# Patient Record
Sex: Female | Born: 1935 | Race: Black or African American | Hispanic: No | Smoking: Never smoker
Health system: Southern US, Community
[De-identification: ages and names within clinical notes are randomized; demographics above are authoritative.]

## PROBLEM LIST (undated history)

## (undated) DIAGNOSIS — I10 Essential (primary) hypertension: Secondary | ICD-10-CM

## (undated) DIAGNOSIS — M1990 Unspecified osteoarthritis, unspecified site: Secondary | ICD-10-CM

---

## 2005-11-18 ENCOUNTER — Emergency Department: Payer: Self-pay | Admitting: Unknown Physician Specialty

## 2005-11-18 ENCOUNTER — Other Ambulatory Visit: Payer: Self-pay

## 2005-11-19 ENCOUNTER — Inpatient Hospital Stay: Payer: Self-pay | Admitting: General Surgery

## 2005-12-14 ENCOUNTER — Inpatient Hospital Stay: Payer: Self-pay | Admitting: Internal Medicine

## 2006-01-05 ENCOUNTER — Emergency Department: Payer: Self-pay | Admitting: General Practice

## 2006-03-12 ENCOUNTER — Emergency Department: Payer: Self-pay | Admitting: Emergency Medicine

## 2007-01-26 ENCOUNTER — Ambulatory Visit: Payer: Self-pay | Admitting: Internal Medicine

## 2007-08-12 ENCOUNTER — Emergency Department: Payer: Self-pay | Admitting: Emergency Medicine

## 2007-08-12 ENCOUNTER — Other Ambulatory Visit: Payer: Self-pay

## 2008-01-29 ENCOUNTER — Emergency Department: Payer: Self-pay | Admitting: Emergency Medicine

## 2008-01-29 ENCOUNTER — Other Ambulatory Visit: Payer: Self-pay

## 2008-09-21 ENCOUNTER — Ambulatory Visit: Payer: Self-pay | Admitting: Internal Medicine

## 2009-12-09 ENCOUNTER — Emergency Department: Payer: Self-pay | Admitting: Emergency Medicine

## 2011-09-07 ENCOUNTER — Emergency Department: Payer: Self-pay | Admitting: Emergency Medicine

## 2011-11-21 ENCOUNTER — Emergency Department: Payer: Self-pay | Admitting: *Deleted

## 2011-11-21 LAB — CBC
HGB: 11.6 g/dL — ABNORMAL LOW (ref 12.0–16.0)
MCH: 30.3 pg (ref 26.0–34.0)
MCHC: 33 g/dL (ref 32.0–36.0)
Platelet: 270 10*3/uL (ref 150–440)
RBC: 3.84 10*6/uL (ref 3.80–5.20)
RDW: 13.9 % (ref 11.5–14.5)

## 2011-11-21 LAB — COMPREHENSIVE METABOLIC PANEL
Alkaline Phosphatase: 103 U/L (ref 50–136)
Anion Gap: 7 (ref 7–16)
BUN: 14 mg/dL (ref 7–18)
Bilirubin,Total: 0.3 mg/dL (ref 0.2–1.0)
Calcium, Total: 9.4 mg/dL (ref 8.5–10.1)
Chloride: 105 mmol/L (ref 98–107)
Creatinine: 1.04 mg/dL (ref 0.60–1.30)
EGFR (African American): >60
Osmolality: 284 (ref 275–301)
SGPT (ALT): 23 U/L

## 2011-11-21 LAB — URINALYSIS, COMPLETE
Bilirubin,UR: NEGATIVE
Blood: NEGATIVE
Glucose,UR: NEGATIVE mg/dL (ref 0–75)
Ketone: NEGATIVE
Protein: NEGATIVE
Specific Gravity: 1.006 (ref 1.003–1.030)
Squamous Epithelial: <1
WBC UR: 18 /HPF (ref 0–5)

## 2012-02-25 ENCOUNTER — Ambulatory Visit: Payer: Self-pay | Admitting: Internal Medicine

## 2014-01-27 ENCOUNTER — Inpatient Hospital Stay: Payer: Self-pay | Admitting: Internal Medicine

## 2014-01-27 LAB — URINALYSIS, COMPLETE
BILIRUBIN, UR: NEGATIVE
Blood: NEGATIVE
Glucose,UR: NEGATIVE mg/dL (ref 0–75)
Hyaline Cast: 2
KETONE: NEGATIVE
Nitrite: NEGATIVE
Ph: 5 (ref 4.5–8.0)
Protein: NEGATIVE
RBC,UR: <1 /HPF (ref 0–5)
Renal Epithelial: <1
Specific Gravity: 1.01 (ref 1.003–1.030)
Squamous Epithelial: 1
Transitional Epi: 1
WBC UR: 44 /HPF (ref 0–5)

## 2014-01-27 LAB — BASIC METABOLIC PANEL
Anion Gap: 2 — ABNORMAL LOW (ref 7–16)
BUN: 22 mg/dL — AB (ref 7–18)
CALCIUM: 9.3 mg/dL (ref 8.5–10.1)
Chloride: 105 mmol/L (ref 98–107)
Co2: 26 mmol/L (ref 21–32)
Creatinine: 1.57 mg/dL — ABNORMAL HIGH (ref 0.60–1.30)
GFR CALC AF AMER: 34 — AB
GFR CALC NON AF AMER: 29 — AB
GLUCOSE: 126 mg/dL — AB (ref 65–99)
Osmolality: 271 (ref 275–301)
POTASSIUM: 4.2 mmol/L (ref 3.5–5.1)
SODIUM: 133 mmol/L — AB (ref 136–145)

## 2014-01-27 LAB — CBC
HCT: 34.6 % — ABNORMAL LOW (ref 35.0–47.0)
HGB: 11.6 g/dL — AB (ref 12.0–16.0)
MCH: 30.5 pg (ref 26.0–34.0)
MCHC: 33.5 g/dL (ref 32.0–36.0)
MCV: 91 fL (ref 80–100)
PLATELETS: 245 10*3/uL (ref 150–440)
RBC: 3.79 10*6/uL — ABNORMAL LOW (ref 3.80–5.20)
RDW: 13.4 % (ref 11.5–14.5)
WBC: 6.4 10*3/uL (ref 3.6–11.0)

## 2014-01-27 LAB — TROPONIN I: Troponin-I: <0.02 ng/mL

## 2014-01-28 LAB — CBC WITH DIFFERENTIAL/PLATELET
BASOS PCT: 0.9 %
Basophil #: 0.1 10*3/uL (ref 0.0–0.1)
Eosinophil #: 0.1 10*3/uL (ref 0.0–0.7)
Eosinophil %: 2 %
HCT: 35.3 % (ref 35.0–47.0)
HGB: 11.8 g/dL — AB (ref 12.0–16.0)
Lymphocyte #: 1.8 10*3/uL (ref 1.0–3.6)
Lymphocyte %: 30.7 %
MCH: 30.5 pg (ref 26.0–34.0)
MCHC: 33.4 g/dL (ref 32.0–36.0)
MCV: 91 fL (ref 80–100)
MONO ABS: 0.6 x10 3/mm (ref 0.2–0.9)
MONOS PCT: 10.1 %
Neutrophil #: 3.2 10*3/uL (ref 1.4–6.5)
Neutrophil %: 56.3 %
Platelet: 253 10*3/uL (ref 150–440)
RBC: 3.87 10*6/uL (ref 3.80–5.20)
RDW: 13.3 % (ref 11.5–14.5)
WBC: 5.7 10*3/uL (ref 3.6–11.0)

## 2014-01-28 LAB — COMPREHENSIVE METABOLIC PANEL
ALK PHOS: 93 U/L
Albumin: 3.3 g/dL — ABNORMAL LOW (ref 3.4–5.0)
Anion Gap: 7 (ref 7–16)
BUN: 19 mg/dL — ABNORMAL HIGH (ref 7–18)
Bilirubin,Total: 0.3 mg/dL (ref 0.2–1.0)
CO2: 27 mmol/L (ref 21–32)
Calcium, Total: 9.6 mg/dL (ref 8.5–10.1)
Chloride: 105 mmol/L (ref 98–107)
Creatinine: 1.23 mg/dL (ref 0.60–1.30)
EGFR (African American): 46 — ABNORMAL LOW
EGFR (Non-African Amer.): 39 — ABNORMAL LOW
GLUCOSE: 114 mg/dL — AB (ref 65–99)
Osmolality: 281 (ref 275–301)
Potassium: 4.8 mmol/L (ref 3.5–5.1)
SGOT(AST): 14 U/L — ABNORMAL LOW (ref 15–37)
SGPT (ALT): 12 U/L (ref 12–78)
SODIUM: 139 mmol/L (ref 136–145)
Total Protein: 6.7 g/dL (ref 6.4–8.2)

## 2014-01-28 LAB — HEMATOCRIT: HCT: 35.2 % (ref 35.0–47.0)

## 2014-01-28 LAB — MAGNESIUM: Magnesium: 2.1 mg/dL

## 2014-01-28 LAB — HEMOGLOBIN: HGB: 11.6 g/dL — AB (ref 12.0–16.0)

## 2014-01-29 LAB — CBC WITH DIFFERENTIAL/PLATELET
BASOS PCT: 0.6 %
Basophil #: 0 10*3/uL (ref 0.0–0.1)
Eosinophil #: 0 10*3/uL (ref 0.0–0.7)
Eosinophil %: 0.7 %
HCT: 33.5 % — ABNORMAL LOW (ref 35.0–47.0)
HGB: 10.9 g/dL — ABNORMAL LOW (ref 12.0–16.0)
LYMPHS PCT: 23.9 %
Lymphocyte #: 1.5 10*3/uL (ref 1.0–3.6)
MCH: 29.9 pg (ref 26.0–34.0)
MCHC: 32.5 g/dL (ref 32.0–36.0)
MCV: 92 fL (ref 80–100)
Monocyte #: 0.4 x10 3/mm (ref 0.2–0.9)
Monocyte %: 6 %
NEUTROS ABS: 4.2 10*3/uL (ref 1.4–6.5)
Neutrophil %: 68.8 %
PLATELETS: 230 10*3/uL (ref 150–440)
RBC: 3.64 10*6/uL — AB (ref 3.80–5.20)
RDW: 13.2 % (ref 11.5–14.5)
WBC: 6.1 10*3/uL (ref 3.6–11.0)

## 2014-01-29 LAB — BASIC METABOLIC PANEL
Anion Gap: 4 — ABNORMAL LOW (ref 7–16)
BUN: 15 mg/dL (ref 7–18)
CHLORIDE: 105 mmol/L (ref 98–107)
CREATININE: 1.32 mg/dL — AB (ref 0.60–1.30)
Calcium, Total: 9.2 mg/dL (ref 8.5–10.1)
Co2: 27 mmol/L (ref 21–32)
EGFR (African American): 42 — ABNORMAL LOW
EGFR (Non-African Amer.): 36 — ABNORMAL LOW
Glucose: 103 mg/dL — ABNORMAL HIGH (ref 65–99)
OSMOLALITY: 273 (ref 275–301)
POTASSIUM: 4.8 mmol/L (ref 3.5–5.1)
SODIUM: 136 mmol/L (ref 136–145)

## 2014-01-29 LAB — URINE CULTURE

## 2014-01-31 LAB — CBC WITH DIFFERENTIAL/PLATELET
BASOS PCT: 0.6 %
Basophil #: 0.1 10*3/uL (ref 0.0–0.1)
Eosinophil #: 0.1 10*3/uL (ref 0.0–0.7)
Eosinophil %: 0.8 %
HCT: 36.2 % (ref 35.0–47.0)
HGB: 12 g/dL (ref 12.0–16.0)
Lymphocyte #: 1.4 10*3/uL (ref 1.0–3.6)
Lymphocyte %: 17.7 %
MCH: 30 pg (ref 26.0–34.0)
MCHC: 33.1 g/dL (ref 32.0–36.0)
MCV: 91 fL (ref 80–100)
MONO ABS: 0.7 x10 3/mm (ref 0.2–0.9)
Monocyte %: 9.3 %
Neutrophil #: 5.7 10*3/uL (ref 1.4–6.5)
Neutrophil %: 71.6 %
PLATELETS: 237 10*3/uL (ref 150–440)
RBC: 3.99 10*6/uL (ref 3.80–5.20)
RDW: 13.4 % (ref 11.5–14.5)
WBC: 8 10*3/uL (ref 3.6–11.0)

## 2014-01-31 LAB — BASIC METABOLIC PANEL
ANION GAP: 5 — AB (ref 7–16)
BUN: 11 mg/dL (ref 7–18)
CHLORIDE: 101 mmol/L (ref 98–107)
CREATININE: 1.27 mg/dL (ref 0.60–1.30)
Calcium, Total: 9.4 mg/dL (ref 8.5–10.1)
Co2: 29 mmol/L (ref 21–32)
EGFR (African American): 44 — ABNORMAL LOW
EGFR (Non-African Amer.): 38 — ABNORMAL LOW
GLUCOSE: 115 mg/dL — AB (ref 65–99)
Osmolality: 270 (ref 275–301)
Potassium: 4.3 mmol/L (ref 3.5–5.1)
Sodium: 135 mmol/L — ABNORMAL LOW (ref 136–145)

## 2014-02-01 LAB — CBC WITH DIFFERENTIAL/PLATELET
BASOS ABS: 0 10*3/uL (ref 0.0–0.1)
BASOS PCT: 0.5 %
EOS PCT: 0.4 %
Eosinophil #: 0 10*3/uL (ref 0.0–0.7)
HCT: 33.8 % — AB (ref 35.0–47.0)
HGB: 11.6 g/dL — ABNORMAL LOW (ref 12.0–16.0)
Lymphocyte #: 1 10*3/uL (ref 1.0–3.6)
Lymphocyte %: 13 %
MCH: 30.7 pg (ref 26.0–34.0)
MCHC: 34.2 g/dL (ref 32.0–36.0)
MCV: 90 fL (ref 80–100)
MONOS PCT: 10.9 %
Monocyte #: 0.8 x10 3/mm (ref 0.2–0.9)
Neutrophil #: 5.6 10*3/uL (ref 1.4–6.5)
Neutrophil %: 75.2 %
PLATELETS: 222 10*3/uL (ref 150–440)
RBC: 3.76 10*6/uL — ABNORMAL LOW (ref 3.80–5.20)
RDW: 13.3 % (ref 11.5–14.5)
WBC: 7.5 10*3/uL (ref 3.6–11.0)

## 2014-02-01 LAB — BASIC METABOLIC PANEL
Anion Gap: 8 (ref 7–16)
BUN: 15 mg/dL (ref 7–18)
CREATININE: 1.06 mg/dL (ref 0.60–1.30)
Calcium, Total: 9.7 mg/dL (ref 8.5–10.1)
Chloride: 96 mmol/L — ABNORMAL LOW (ref 98–107)
Co2: 29 mmol/L (ref 21–32)
EGFR (Non-African Amer.): 47 — ABNORMAL LOW
GFR CALC AF AMER: 55 — AB
GLUCOSE: 150 mg/dL — AB (ref 65–99)
OSMOLALITY: 270 (ref 275–301)
Potassium: 4 mmol/L (ref 3.5–5.1)
SODIUM: 133 mmol/L — AB (ref 136–145)

## 2014-02-25 ENCOUNTER — Emergency Department: Payer: Self-pay | Admitting: Emergency Medicine

## 2014-02-25 LAB — BASIC METABOLIC PANEL
Anion Gap: 4 — ABNORMAL LOW (ref 7–16)
BUN: 24 mg/dL — ABNORMAL HIGH (ref 7–18)
CALCIUM: 9.2 mg/dL (ref 8.5–10.1)
CO2: 32 mmol/L (ref 21–32)
CREATININE: 1.43 mg/dL — AB (ref 0.60–1.30)
Chloride: 100 mmol/L (ref 98–107)
EGFR (Non-African Amer.): 33 — ABNORMAL LOW
GFR CALC AF AMER: 38 — AB
GLUCOSE: 118 mg/dL — AB (ref 65–99)
Osmolality: 277 (ref 275–301)
Potassium: 5.1 mmol/L (ref 3.5–5.1)
Sodium: 136 mmol/L (ref 136–145)

## 2014-02-25 LAB — CBC
HCT: 34.6 % — AB (ref 35.0–47.0)
HGB: 11.3 g/dL — AB (ref 12.0–16.0)
MCH: 29.2 pg (ref 26.0–34.0)
MCHC: 32.5 g/dL (ref 32.0–36.0)
MCV: 90 fL (ref 80–100)
Platelet: 249 10*3/uL (ref 150–440)
RBC: 3.86 10*6/uL (ref 3.80–5.20)
RDW: 12.9 % (ref 11.5–14.5)
WBC: 6.4 10*3/uL (ref 3.6–11.0)

## 2014-12-03 ENCOUNTER — Emergency Department
Admit: 2014-12-03 | Disposition: A | Discharge status: Home / Self Care | Payer: Self-pay | Admitting: Emergency Medicine

## 2014-12-03 LAB — CBC
HCT: 35.9 % (ref 35.0–47.0)
HGB: 11.8 g/dL — AB (ref 12.0–16.0)
MCH: 30 pg (ref 26.0–34.0)
MCHC: 32.7 g/dL (ref 32.0–36.0)
MCV: 92 fL (ref 80–100)
Platelet: 262 10*3/uL (ref 150–440)
RBC: 3.92 10*6/uL (ref 3.80–5.20)
RDW: 14.2 % (ref 11.5–14.5)
WBC: 5.4 10*3/uL (ref 3.6–11.0)

## 2014-12-03 LAB — BASIC METABOLIC PANEL
ANION GAP: 6 — AB (ref 7–16)
BUN: 16 mg/dL
CALCIUM: 9.4 mg/dL
Chloride: 108 mmol/L
Co2: 28 mmol/L
Creatinine: 0.83 mg/dL
Glucose: 148 mg/dL — ABNORMAL HIGH
POTASSIUM: 3.5 mmol/L
SODIUM: 142 mmol/L

## 2014-12-04 ENCOUNTER — Emergency Department: Admit: 2014-12-04 | Disposition: A | Discharge status: Home / Self Care | Payer: Self-pay | Admitting: Student

## 2014-12-09 NOTE — Consult Note (Signed)
PATIENT NAME:  Rhonda Nelson, Lian E MR#:  [id number] DATE OF BIRTH:  1924-10-13  DATE OF CONSULTATION:  01/28/2014  CONSULTING PHYSICIAN:  Ezzard StandingPaul Y. Chancey Ringel, MD  REASON FOR CONSULTATION: Melena, anemia.   HISTORY OF PRESENT ILLNESS: The patient is an 81 year old black female with past medical history of hypertension, hyperlipidemia, and coronary artery disease who was brought in to the Emergency Room after she had a large black, tarry stool yesterday morning. She felt weak and it was difficult to get out of the commode. As a result, she was brought in. Even though she felt dizzy and weak she never lost any consciousness. This is the first time she has noticed any black stools. She denies any abdominal pain. She felt nauseous but did not vomit. There are no fevers or chills. Her appetite has been somewhat down but she is not aware whether she lost any weight or not.   The patient has been taking meloxicam daily for the past several months presumably for arthritis She does not take any stomach medicines.   She has not had any further bleeding since admission.   PAST MEDICAL HISTORY: Hyperlipidemia, coronary artery disease. She did have a bout of intestinal obstruction in the past. Other history includes leaky valves.   PAST SURGICAL HISTORY: Include cholecystectomy, hysterectomy and laparotomy for intestinal obstruction.   ALLERGIES: ALEVE AND MOTRIN.   SOCIAL HISTORY: She lives alone. Does not smoke or drink.   FAMILY HISTORY: Notable for hypertension.   HOME MEDICATIONS: Include:  Meloxicam 7.5 mg daily, lisinopril 20 mg daily and amlodipine 5 mg daily.  REVIEW OF SYSTEMS:  Please refer to the initial H and P dictated by the admitting doctor. There has not been any changes.   PHYSICAL EXAMINATION: GENERAL: The patient is in no acute distress.  VITAL SIGNS: She is afebrile. Vital signs are stable.  HEENT: Normocephalic, atraumatic head. Pupils are equally reactive. Throat was clear.  NECK: Supple.   CARDIAC: Regular rhythm and rate with murmur.  LUNGS: Clear bilaterally.  ABDOMEN: Normoactive bowel sounds, soft. It was nontender throughout. There is no hepatomegaly. She had active bowel sounds.  NEUROLOGIC: Examination is nonfocal although she was rather poor historian.  EXTREMITIES: No clubbing, clubbing, cyanosis, or edema.  SKIN: Negative.   LABORATORY DATA: Initial hemoglobin on admission was 11.6, even with IV hydration did not drop any further. It was 11.8 this morning. Electrolytes, creatinine was 1.57. With hydration went down to 1.23. Sodium 139, potassium 4.8, chloride 105, CO2 27, BUN was 19, glucose 114. Liver enzymes were normal. TPK level was less than 0.02. She does have evidence of urinary tract infection.   ASSESSMENT AND PLAN: This is a patient who had an episode of melena with some presyncopal episode. She is anemic. Fortunately for her, even with IV hydration her hemoglobin has not dropped significantly, which suggested there is no active bleeding right now. The patient likely has NSAID-induced bleeding ulcer. There is no urgency in doing endoscopy. We will plan doing endoscopy on Monday unless the patient develops active bleeding. We will place a patient on liquid diet   We will continue to monitor the hemoglobin. The patient is already put on proton pump inhibitor, which should continue on a daily basis. I will continue to follow the patient. Thank you for the referral.   ____________________________ Ezzard StandingPaul Y. Bluford Kaufmannh, MD pyo:sg D: 01/29/2014 08:46:49 ET T: 01/29/2014 09:28:39 ET JOB#: [id number]  cc: Ezzard StandingPaul Y. Bluford Kaufmannh, MD, <Dictator> Ezzard StandingPAUL Y Thaddaeus Granja MD ELECTRONICALLY SIGNED 01/30/2014  10:25 

## 2014-12-09 NOTE — H&P (Signed)
PATIENT NAME:  Rhonda Nelson, [name] MR#:  [id number] DATE OF BIRTH:  1925-04-11  DATE OF ADMISSION:  01/28/2014  PRIMARY CARE PHYSICIAN:  Dr. Marcello FennelHande.  REFERRING PHYSICIAN:  Governor Rooksebecca Lord, M.D.   CHIEF COMPLAINT:  Black tarry stool.   HISTORY OF PRESENT ILLNESS:  The patient is an 81 year old female with past medical history of hypertension, hyperlipidemia and coronary artery disease was brought into the ER via EMS for black tarry stool.  The patient is reporting that this morning when she went to bathroom she had a large black tarry stool and became extremely weak.  The patient lives alone and she was unable to get off the commode.  She has called EMS who has recommended the patient to come to the ER.  The patient has refused and subsequently she called her daughter.  Daughter went to check on her and as the patient was extremely weak she has called EMS again and eventually the patient is brought into the ED.  She is feeling weak and dizzy, but denies any loss of consciousness.  She was nauseous, but denies any vomiting.  Denies any abdominal pain.  No similar complaints in the past.  The patient is also diagnosed with a urinary tract infection.  She was given IV Rocephin and hospitalist team is called to admit the patient.  During my examination, the patient is resting comfortably.  Denies any abdominal pain other than generalized weakness.  Daughter and son are at bedside.   PAST MEDICAL HISTORY:  Hypertension, hyperlipidemia, coronary artery disease, history of intestinal obstruction and kidney stone.  Also history of leaky valve.  Osteoarthritis of the knees.   PAST SURGICAL HISTORY:  Cholecystectomy, total hysterectomy, exploratory laparotomy for intestinal obstruction.   ALLERGIES:  SHE IS ALLERGIC TO ALEVE AND MOTRIN.   PSYCHOSOCIAL HISTORY:  Lives at home, lives alone.  She gets Meals on Wheels.  Daughter frequently comes and checks on her mom.   FAMILY HISTORY:  Hypertension runs in her family.    HOME MEDICATIONS:  Meloxicam 7.5 mg 1 tablet by mouth once daily with food, lisinopril 20 mg once daily, amlodipine 5 mg once daily.   REVIEW OF SYSTEMS: CONSTITUTIONAL:  Denies any fever, fatigue.  Complaining of weakness.  EYES:  Denies blurry vision, double vision, glaucoma.  EARS, NOSE, THROAT:  Denies epistaxis, discharge.  RESPIRATION:  Denies cough, COPD.  CARDIOVASCULAR:  No chest pain, palpitations.  Complaining of dizziness.  No syncope.  GASTROINTESTINAL:  Has nausea.  Denies vomiting.  Passed black tarry stool.  Denies any abdominal pain.  Denies any hematemesis or positive melena.  GENITOURINARY:  No dysuria, hematuria, or urinary frequency.  Has history of kidney stones in the past.  GYNECOLOGIC AND BREASTS:  Denies breast mass.  Status post hysterectomy.  Denies any vaginal discharge.  ENDOCRINE:  Denies polyuria, nocturia, thyroid problems.  HEMATOLOGIC AND LYMPHATIC:  Positive anemia.  Positive melena.  INTEGUMENTARY:  No acne, rash, lesions.  MUSCULOSKELETAL:  No joint pain in the neck and back.  Has knee pain.  She is supposed to get total knee replacement for severe osteoarthritis.  NEUROLOGIC:  Denies vertigo, ataxia, dysarthria.  PSYCHIATRIC:  No ADD, OCD.   PHYSICAL EXAMINATION: VITAL SIGNS:  Temperature 98, pulse 76, respirations 18, blood pressure 121/74, pulse ox is 97%.  GENERAL APPEARANCE:  Not under acute distress.  Moderately built and nourished.  HEENT:  Normocephalic, atraumatic.  Pupils are equally reacting to light and accommodation.  No scleral icterus.  No conjunctival  injection.  No sinus tenderness.  No postnasal drip.  Dry mucous membranes. NECK:  Supple.  No JVD.  No thyromegaly.  Range of motion is intact.  LUNGS:  Clear to auscultation bilaterally.  No accessory muscle usage.  No anterior chest wall tenderness on palpation.  CARDIAC:  S1, S2 normal.  Regular rate and rhythm.  Positive murmur noted.  GASTROINTESTINAL:  Soft.  Bowel sounds are  positive in all four quadrants.  Nontender, nondistended.  No hepatosplenomegaly.  No masses felt.  NEUROLOGIC:  Awake, alert, oriented x 3.  Cranial nerves II through XII are grossly intact.  Motor and sensory are intact.  Reflexes are 2+.  EXTREMITIES:  No edema.  No cyanosis.  No clubbing.  SKIN:  Warm to touch.  Decreased turgor.  No rashes.  No lesions.  MUSCULOSKELETAL:  No joint effusion.  No erythema.   LABORATORY AND IMAGING STUDIES:  Troponin less than 0.02.  WBC 6.4, hemoglobin is 11.6, hematocrit 34.6, platelets 245.  Urinalysis, yellow in color, clear in appearance.  Nitrites, are negative, leukocyte esterase 3+.  Chem-8:  Glucose 126, BUN 22, creatinine 1.5.  Serum sodium 133, potassium 4.2, chloride normal.  CO2 normal.  Anion gap is 2.  GFR 34, serum osmolality and calcium are normal.   ASSESSMENT AND PLAN:  An 81 year old African American female presenting to the Emergency Room after she had a large melena stool associated with weakness will be admitted with the following assessment and plan.  1.  Upper gastrointestinal bleed, probably nonsteroidal anti-inflammatory drug related.  We will keep her nothing by mouth, provide intravenous fluids, proton pump inhibitor GTT.  We will check hemoglobin and hematocrit q. 6 hours.  If necessary, we will provide blood transfusion.  Consent was obtained.  Gastroenterology consult is placed.  Stop nonsteroidal anti-inflammatory drugs.   2.  Acute cystitis.  Urine culture is ordered.  The patient will be receiving intravenous Rocephin.  3.  Hypertension.  Currently, the patient is nothing by mouth.  If necessary, we will provide her intravenous antihypertensive and hold off on home medications.  4.  History of coronary artery disease.  Denies any chest pain or shortness of breath.  No other complaints.  Troponin is negative.   5.  We will provide her gastrointestinal prophylaxis with Protonix and deep vein thrombosis prophylaxis with sequential  compression devices.  6.  CODE STATUS:  SHE IS FULL CODE.  Daughter is the medical power of attorney.    Diagnosis and plan of care was discussed in detail with the patient.  Daughter and son at bedside.  They all verbalized understanding of the plan.   Total time spent on the admission is 50 minutes.  Please transfer the patient to Dr. Marcello Fennel in a.m.    ____________________________ Ramonita Lab, MD ag:ea D: 01/28/2014 01:09:07 ET T: 01/28/2014 01:51:21 ET JOB#: [id number]  cc: Ramonita Lab, MD, <Dictator> Ramonita Lab MD ELECTRONICALLY SIGNED 02/05/2014 0:56

## 2014-12-09 NOTE — Consult Note (Signed)
Pt seen and examined. Full consult to follow. Pt with hx of HTN, hyperlipedemia, and CAD, who was admitted overnight for large bout of melena and near syncopal episode. Found to be anemic, although hgb has not fallen since admission even with IV hydration. Poor historian. On meloxicam daily for few months. Nausea yesterday but denies GI sxs prior to yesterday. Abdomen nontender. Likely has NSAIDS-induced bleeding ulcer. No urgency in EGD. Will plan on Monday unless patient has active bleeding. Liquid diet today. Moniter hgb. Continue PPI daily. Will follow. Thanks.  Electronic Signatures: Lutricia Feilh, Bernarda Erck (MD)  (Signed on 13-Jun-15 10:14)  Authored  Last Updated: 13-Jun-15 10:14 by Lutricia Feilh, Leda Bellefeuille (MD)

## 2014-12-09 NOTE — Consult Note (Signed)
Chief Complaint:  Subjective/Chief Complaint No further bleeding. Sl drop in hgb. Tolerating clears.   VITAL SIGNS/ANCILLARY NOTES: **Vital Signs.:   14-Jun-15 04:12  Vital Signs Type Routine  Temperature Temperature (F) 98.5  Celsius 36.9  Temperature Source oral  Pulse Pulse 68  Respirations Respirations 20  Systolic BP Systolic BP 797  Diastolic BP (mmHg) Diastolic BP (mmHg) 60  Mean BP 73  Pulse Ox % Pulse Ox % 91  Pulse Ox Activity Level  At rest  Oxygen Delivery Room Air/ 21 %   Brief Assessment:  GEN no acute distress   Cardiac Regular   Respiratory clear BS   Gastrointestinal Normal   Lab Results: Routine Chem:  14-Jun-15 04:48   Glucose, Serum  103  BUN 15  Creatinine (comp)  1.32  Sodium, Serum 136  Potassium, Serum 4.8  Chloride, Serum 105  CO2, Serum 27  Calcium (Total), Serum 9.2  Anion Gap  4  Osmolality (calc) 273  eGFR (African American)  42  eGFR (Non-African American)  36 (eGFR values <85m/min/1.73 m2 may be an indication of chronic kidney disease (CKD). Calculated eGFR is useful in patients with stable renal function. The eGFR calculation will not be reliable in acutely ill patients when serum creatinine is changing rapidly. It is not useful in  patients on dialysis. The eGFR calculation may not be applicable to patients at the low and high extremes of body sizes, pregnant women, and vegetarians.)  Routine Hem:  14-Jun-15 04:48   WBC (CBC) 6.1  RBC (CBC)  3.64  Hemoglobin (CBC)  10.9  Hematocrit (CBC)  33.5  Platelet Count (CBC) 230  MCV 92  MCH 29.9  MCHC 32.5  RDW 13.2  Neutrophil % 68.8  Lymphocyte % 23.9  Monocyte % 6.0  Eosinophil % 0.7  Basophil % 0.6  Neutrophil # 4.2  Lymphocyte # 1.5  Monocyte # 0.4  Eosinophil # 0.0  Basophil # 0.0 (Result(s) reported on 29 Jan 2014 at 06:13AM.)   Assessment/Plan:  Assessment/Plan:  Assessment UGI bleed. No active bleeding.   Plan Full liquid today but NPO after MN. Plan EGD  tomorrow AM. thanks   Electronic Signatures: OVerdie Shire(MD)  (Signed 14-Jun-15 09:23)  Authored: Chief Complaint, VITAL SIGNS/ANCILLARY NOTES, Brief Assessment, Lab Results, Assessment/Plan   Last Updated: 14-Jun-15 09:23 by OVerdie Shire(MD)

## 2014-12-09 NOTE — Discharge Summary (Signed)
PATIENT NAME:  Rhonda Nelson, Rhonda Nelson MR#:  [id number] DATE OF BIRTH:  [birth year]  DATE OF ADMISSION:  01/27/2014 DATE OF DISCHARGE:  01/31/2014   DIAGNOSES AT TIME OF DISCHARGE: 1.  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  2.  Osteoarthritis.  3.  Hypertension  4.  History of coronary artery disease.  5.  Urinary tract infection.   CHIEF COMPLAINT: Black tarry stools.   HISTORY OF PRESENT ILLNESS: Rhonda Nelson is an 81 year old female with past medical history significant for hypertension, hyperlipidemia, CAD, was brought to the ER via EMS because of black tarry stools. The patient states that she went to the bathroom and had a bowel movement and was noted to have black stools, and also patient reported a sense of weakness. She was unable to get off the commode. The patient also reported a sense of feeling dizzy and weak. Denies any loss of consciousness. Denies any vomiting. No abdominal pain. Has had no similar complaints in the past. The patient is also noted to have a urinary tract infection and was started on IV Rocephin.   PAST MEDICAL HISTORY: Significant for hypertension, hyperlipidemia, CAD, history of intestinal obstruction and kidney stone, history of osteoarthritis of the knees. She has had previous cholecystectomy, hysterectomy, exploratory laparotomy.  HOSPITAL COURSE:  Please see H and P for other details. The patient was [facility]. Initial labs showed a hemoglobin of 11.6. She was seen by gastroenterologist, Dr. Bluford Kaufmannh, and underwent an EGD which showed a gastric AVM that was cauterized. There was no evidence of any bleeding ulcers. A regular diet was ordered. The patient continued to experience pain in both knees and was started on tramadol for pain control.   DISCHARGE MEDICATIONS: She was discharged to rehab in stable condition on the following medications: Ferrous sulfate 325 mg once a day, lisinopril 20 mg once a day, amlodipine  5 mg once a day, pantoprazole 40 mg b.i.d., docusate sodium 100 mg p.o. b.i.d., and tramadol 50 mg q.6 hours.  The patient was advised to follow up with me, Dr. Marcello FennelHande, in 1 to 2 weeks' time. She was stable at the time of discharge.   TOTAL TIME SPENT ON DISCHARGING THIS PATIENT: 35 minutes.   ____________________________ Barbette ReichmannVishwanath Hande, MD vh:jcm D: 01/31/2014 12:56:32 ET T: 01/31/2014 15:17:13 ET JOB#: [id number]  cc: Barbette ReichmannVishwanath Hande, MD, <Dictator> Barbette ReichmannVISHWANATH HANDE MD ELECTRONICALLY SIGNED 02/15/2014 18:09

## 2014-12-09 NOTE — Consult Note (Signed)
EGD showed a gastric AVM that was cauterized. Did not see any bleeding ulcers. Reg diet ordered. Continue daily PPI once a day if patient requires NSAIDS at home. Will sign off. Thanks.  Electronic Signatures: Lutricia Feilh, Floetta Brickey (MD)  (Signed on 15-Jun-15 10:19)  Authored  Last Updated: 15-Jun-15 10:19 by Lutricia Feilh, Thurza Kwiecinski (MD)

## 2015-01-11 ENCOUNTER — Ambulatory Visit
Admission: RE | Admit: 2015-01-11 | Discharge: 2015-01-11 | Disposition: A | Discharge status: Home / Self Care | Payer: Medicare Other | Source: Ambulatory Visit | Attending: Internal Medicine | Admitting: Internal Medicine

## 2015-01-11 ENCOUNTER — Other Ambulatory Visit: Payer: Self-pay | Admitting: Internal Medicine

## 2015-01-11 DIAGNOSIS — M7989 Other specified soft tissue disorders: Secondary | ICD-10-CM

## 2015-01-25 ENCOUNTER — Other Ambulatory Visit: Payer: Self-pay

## 2015-01-25 ENCOUNTER — Encounter: Payer: Self-pay | Admitting: Emergency Medicine

## 2015-01-25 ENCOUNTER — Emergency Department
Admission: EM | Admit: 2015-01-25 | Discharge: 2015-01-26 | Disposition: A | Discharge status: Home / Self Care | Payer: Medicare Other | Attending: Emergency Medicine | Admitting: Emergency Medicine

## 2015-01-25 DIAGNOSIS — I10 Essential (primary) hypertension: Secondary | ICD-10-CM | POA: Diagnosis not present

## 2015-01-25 DIAGNOSIS — R1030 Lower abdominal pain, unspecified: Secondary | ICD-10-CM

## 2015-01-25 DIAGNOSIS — K5900 Constipation, unspecified: Secondary | ICD-10-CM | POA: Diagnosis not present

## 2015-01-25 DIAGNOSIS — M79605 Pain in left leg: Secondary | ICD-10-CM | POA: Insufficient documentation

## 2015-01-25 DIAGNOSIS — E119 Type 2 diabetes mellitus without complications: Secondary | ICD-10-CM | POA: Insufficient documentation

## 2015-01-25 DIAGNOSIS — M79604 Pain in right leg: Secondary | ICD-10-CM | POA: Insufficient documentation

## 2015-01-25 HISTORY — DX: Unspecified osteoarthritis, unspecified site: M19.90

## 2015-01-25 HISTORY — DX: Essential (primary) hypertension: I10

## 2015-01-25 NOTE — ED Notes (Signed)
Pt to rm 10 via EMS.  EMS reports pt c/o lower abd pain since 11am after eating.  EMS states pt reported LBM 2 weeks ago, but pt's son gave prune juice and pt had small BM recently.  Pt reports pain to both legs, moreso in left, hx of arthritis.  EMS reports recent Cipro use but unsure of reason.  Pt unable to speak clearly and difficulty answering questions upon arrival.  Unsure if this is normal at baseline.

## 2015-01-26 ENCOUNTER — Emergency Department: Payer: Medicare Other

## 2015-01-26 LAB — COMPREHENSIVE METABOLIC PANEL
ALK PHOS: 87 U/L (ref 38–126)
ALT: 40 U/L (ref 14–54)
AST: 30 U/L (ref 15–41)
Albumin: 3.7 g/dL (ref 3.5–5.0)
Anion gap: 8 (ref 5–15)
BILIRUBIN TOTAL: 0.4 mg/dL (ref 0.3–1.2)
BUN: 24 mg/dL — AB (ref 6–20)
CO2: 24 mmol/L (ref 22–32)
Calcium: 9.7 mg/dL (ref 8.9–10.3)
Chloride: 109 mmol/L (ref 101–111)
Creatinine, Ser: 1.02 mg/dL — ABNORMAL HIGH (ref 0.44–1.00)
GFR calc Af Amer: 55 mL/min — ABNORMAL LOW (ref >60)
GFR calc non Af Amer: 48 mL/min — ABNORMAL LOW (ref >60)
Glucose, Bld: 240 mg/dL — ABNORMAL HIGH (ref 65–99)
Potassium: 4.4 mmol/L (ref 3.5–5.1)
Sodium: 141 mmol/L (ref 135–145)
TOTAL PROTEIN: 7.3 g/dL (ref 6.5–8.1)

## 2015-01-26 LAB — CBC WITH DIFFERENTIAL/PLATELET
BASOS ABS: 0.2 10*3/uL — AB (ref 0–0.1)
Basophils Relative: 2 %
EOS ABS: 0 10*3/uL (ref 0–0.7)
Eosinophils Relative: 0 %
HEMATOCRIT: 38.4 % (ref 35.0–47.0)
Hemoglobin: 12.5 g/dL (ref 12.0–16.0)
LYMPHS PCT: 10 %
Lymphs Abs: 0.9 10*3/uL — ABNORMAL LOW (ref 1.0–3.6)
MCH: 29.6 pg (ref 26.0–34.0)
MCHC: 32.6 g/dL (ref 32.0–36.0)
MCV: 90.9 fL (ref 80.0–100.0)
MONO ABS: 0.3 10*3/uL (ref 0.2–0.9)
Monocytes Relative: 3 %
NEUTROS ABS: 7.6 10*3/uL — AB (ref 1.4–6.5)
NEUTROS PCT: 85 %
Platelets: 221 10*3/uL (ref 150–440)
RBC: 4.22 MIL/uL (ref 3.80–5.20)
RDW: 14.2 % (ref 11.5–14.5)
WBC: 9 10*3/uL (ref 3.6–11.0)

## 2015-01-26 LAB — URINALYSIS COMPLETE WITH MICROSCOPIC (ARMC ONLY)
Bacteria, UA: NONE SEEN
Bilirubin Urine: NEGATIVE
GLUCOSE, UA: 50 mg/dL — AB
Ketones, ur: NEGATIVE mg/dL
Leukocytes, UA: NEGATIVE
Nitrite: NEGATIVE
PH: 5 (ref 5.0–8.0)
PROTEIN: NEGATIVE mg/dL
Specific Gravity, Urine: 1.031 — ABNORMAL HIGH (ref 1.005–1.030)
Squamous Epithelial / LPF: NONE SEEN

## 2015-01-26 LAB — TROPONIN I

## 2015-01-26 LAB — LIPASE, BLOOD: Lipase: 35 U/L (ref 22–51)

## 2015-01-26 MED ORDER — DICYCLOMINE HCL 20 MG PO TABS: 20.0000 mg | ORAL_TABLET | Freq: Four times a day (QID) | ORAL | Status: AC | PRN

## 2015-01-26 MED ORDER — DICYCLOMINE HCL 10 MG/ML IM SOLN
20.0000 mg | Freq: Once | INTRAMUSCULAR | Status: AC
  Administered 2015-01-26: 20 mg via INTRAMUSCULAR

## 2015-01-26 MED ORDER — ONDANSETRON HCL 4 MG PO TABS: 4.0000 mg | ORAL_TABLET | Freq: Three times a day (TID) | ORAL | Status: AC | PRN

## 2015-01-26 MED ORDER — ONDANSETRON HCL 4 MG/2ML IJ SOLN
4.0000 mg | Freq: Once | INTRAMUSCULAR | Status: AC
  Administered 2015-01-26: 4 mg via INTRAVENOUS

## 2015-01-26 MED ORDER — IOHEXOL 300 MG/ML  SOLN
100.0000 mL | Freq: Once | INTRAMUSCULAR | Status: AC | PRN
Start: 2015-01-26 — End: 2015-01-26
  Administered 2015-01-26: 100 mL via INTRAVENOUS

## 2015-01-26 MED ORDER — ONDANSETRON HCL 4 MG/2ML IJ SOLN
INTRAMUSCULAR | Status: AC
  Administered 2015-01-26: 4 mg via INTRAVENOUS
  Filled 2015-01-26: qty 2

## 2015-01-26 MED ORDER — MORPHINE SULFATE 4 MG/ML IJ SOLN
4.0000 mg | Freq: Once | INTRAMUSCULAR | Status: AC
  Administered 2015-01-26: 4 mg via INTRAVENOUS

## 2015-01-26 MED ORDER — LACTULOSE 10 GM/15ML PO SOLN: 20.0000 g | Freq: Every day | ORAL | Status: AC | PRN

## 2015-01-26 MED ORDER — DICYCLOMINE HCL 10 MG/ML IM SOLN
INTRAMUSCULAR | Status: DC
Start: 2015-01-26 — End: 2015-01-26
  Filled 2015-01-26: qty 2

## 2015-01-26 MED ORDER — IOHEXOL 240 MG/ML SOLN
25.0000 mL | Freq: Once | INTRAMUSCULAR | Status: AC | PRN
  Administered 2015-01-26: 25 mL via ORAL

## 2015-01-26 MED ORDER — LACTULOSE 10 GM/15ML PO SOLN
ORAL | Status: AC
  Filled 2015-01-26: qty 60

## 2015-01-26 MED ORDER — HYDROMORPHONE HCL 1 MG/ML IJ SOLN
INTRAMUSCULAR | Status: AC
  Administered 2015-01-26: 0.5 mg via INTRAVENOUS
  Filled 2015-01-26: qty 1

## 2015-01-26 MED ORDER — MORPHINE SULFATE 2 MG/ML IJ SOLN
INTRAMUSCULAR | Status: AC
  Filled 2015-01-26: qty 2

## 2015-01-26 MED ORDER — SODIUM CHLORIDE 0.9 % IV BOLUS (SEPSIS)
1000.0000 mL | Freq: Once | INTRAVENOUS | Status: AC
  Administered 2015-01-26: 1000 mL via INTRAVENOUS

## 2015-01-26 MED ORDER — LACTULOSE 10 GM/15ML PO SOLN
30.0000 g | Freq: Once | ORAL | Status: AC
  Administered 2015-01-26: 30 g via ORAL

## 2015-01-26 MED ORDER — HYDROMORPHONE HCL 1 MG/ML IJ SOLN
0.5000 mg | Freq: Once | INTRAMUSCULAR | Status: AC
  Administered 2015-01-26: 0.5 mg via INTRAVENOUS

## 2015-01-26 NOTE — ED Notes (Signed)
Pt denies current urge to have bowel movement.

## 2015-01-26 NOTE — ED Notes (Signed)
[facility] called to transport pt home.  Son aware and to meet EMS at pt's home

## 2015-01-26 NOTE — ED Notes (Signed)
EMS at bedside

## 2015-01-26 NOTE — Discharge Instructions (Signed)
1. Take laxative as needed for bowel movements (Lactulose 5 day supply). 2. Take medicines as needed for nausea and abdominal pain (Zofran/Bentyl #20). 3. Return to the ER for worsening symptoms, persistent vomiting, fever, difficulty breathing or other concerns.  Abdominal Pain Many things can cause abdominal pain. Usually, abdominal pain is not caused by a disease and will improve without treatment. It can often be observed and treated at home. Your health care provider will do a physical exam and possibly order blood tests and X-rays to help determine the seriousness of your pain. However, in many cases, more time must pass before a clear cause of the pain can be found. Before that point, your health care provider may not know if you need more testing or further treatment. HOME CARE INSTRUCTIONS  Monitor your abdominal pain for any changes. The following actions may help to alleviate any discomfort you are experiencing:  Only take over-the-counter or prescription medicines as directed by your health care provider.  Do not take laxatives unless directed to do so by your health care provider.  Try a clear liquid diet (broth, tea, or water) as directed by your health care provider. Slowly move to a bland diet as tolerated. SEEK MEDICAL CARE IF:  You have unexplained abdominal pain.  You have abdominal pain associated with nausea or diarrhea.  You have pain when you urinate or have a bowel movement.  You experience abdominal pain that wakes you in the night.  You have abdominal pain that is worsened or improved by eating food.  You have abdominal pain that is worsened with eating fatty foods.  You have a fever. SEEK IMMEDIATE MEDICAL CARE IF:   Your pain does not go away within 2 hours.  You keep throwing up (vomiting).  Your pain is felt only in portions of the abdomen, such as the right side or the left lower portion of the abdomen.  You pass bloody or black tarry stools. MAKE  SURE YOU:  Understand these instructions.   Will watch your condition.   Will get help right away if you are not doing well or get worse.  Document Released: 05/14/2005 Document Revised: 08/09/2013 Document Reviewed: 04/13/2013 [facility] Patient Information 2015 [place], [place]. This information is not intended to replace advice given to you by your health care provider. Make sure you discuss any questions you have with your health care provider.  Constipation Constipation is when a person has fewer than three bowel movements a week, has difficulty having a bowel movement, or has stools that are dry, hard, or larger than normal. As people grow older, constipation is more common. If you try to fix constipation with medicines that make you have a bowel movement (laxatives), the problem may get worse. Long-term laxative use may cause the muscles of the colon to become weak. A low-fiber diet, not taking in enough fluids, and taking certain medicines may make constipation worse.  CAUSES   Certain medicines, such as antidepressants, pain medicine, iron supplements, antacids, and water pills.   Certain diseases, such as diabetes, irritable bowel syndrome (IBS), thyroid disease, or depression.   Not drinking enough water.   Not eating enough fiber-rich foods.   Stress or travel.   Lack of physical activity or exercise.   Ignoring the urge to have a bowel movement.   Using laxatives too much.  SIGNS AND SYMPTOMS   Having fewer than three bowel movements a week.   Straining to have a bowel movement.   Having  stools that are hard, dry, or larger than normal.   Feeling full or bloated.   Pain in the lower abdomen.   Not feeling relief after having a bowel movement.  DIAGNOSIS  Your health care provider will take a medical history and perform a physical exam. Further testing may be done for severe constipation. Some tests may include:  A barium enema X-ray to examine  your rectum, colon, and, sometimes, your small intestine.   A sigmoidoscopy to examine your lower colon.   A colonoscopy to examine your entire colon. TREATMENT  Treatment will depend on the severity of your constipation and what is causing it. Some dietary treatments include drinking more fluids and eating more fiber-rich foods. Lifestyle treatments may include regular exercise. If these diet and lifestyle recommendations do not help, your health care provider may recommend taking over-the-counter laxative medicines to help you have bowel movements. Prescription medicines may be prescribed if over-the-counter medicines do not work.  HOME CARE INSTRUCTIONS   Eat foods that have a lot of fiber, such as fruits, vegetables, whole grains, and beans.  Limit foods high in fat and processed sugars, such as french fries, hamburgers, cookies, candies, and soda.   A fiber supplement may be added to your diet if you cannot get enough fiber from foods.   Drink enough fluids to keep your urine clear or pale yellow.   Exercise regularly or as directed by your health care provider.   Go to the restroom when you have the urge to go. Do not hold it.   Only take over-the-counter or prescription medicines as directed by your health care provider. Do not take other medicines for constipation without talking to your health care provider first.  SEEK IMMEDIATE MEDICAL CARE IF:   You have bright red blood in your stool.   Your constipation lasts for more than 4 days or gets worse.   You have abdominal or rectal pain.   You have thin, pencil-like stools.   You have unexplained weight loss. MAKE SURE YOU:   Understand these instructions.  Will watch your condition.  Will get help right away if you are not doing well or get worse. Document Released: 05/02/2004 Document Revised: 08/09/2013 Document Reviewed: 05/16/2013 [facility] Patient Information 2015 [place], [place]. This information is  not intended to replace advice given to you by your health care provider. Make sure you discuss any questions you have with your health care provider.

## 2015-01-26 NOTE — ED Notes (Signed)
Pt states unable to give urine sample at this time.  Pt's son reports pt is normally continent.  Pt instructed to press call bell when she feels any urge to urinate.

## 2015-01-26 NOTE — ED Provider Notes (Signed)
[facility] Emergency Department Provider Note  ____________________________________________  Time seen: Approximately 12:16 AM  I have reviewed the triage vital signs and the nursing notes.   HISTORY  Chief Complaint Abdominal Pain and Leg Pain  History limited by vague historian   HPI Rhonda Nelson is a 81 y.o. female who presents via EMS for low abdominal pain since 11 AM after eating a hamburger. Patient describes waxing/waning dull and sharp nonradiating pain associated with nausea only. Patient unable to rate level of pain. Patient's son states he gave her prune juice and patient had a small bowel movement this evening. EMS reports patient's last good bowel movement approximately 2 weeks ago.Chart review shows that patient recently finished Cipro for UTI. Patient has also had a several month history of bilateral leg pain, left greater than right with a recent negative left lower extremity Doppler. Patient denies fever, chills, chest pain, shortness of breath, headache, back pain, numbness, tingling, weakness.   Past Medical History  Diagnosis Date  . Arthritis   . Hypertension   . Varicella  . Anemia  . CAD (coronary artery disease)  . Peripheral vascular disease  . Diabetes mellitus type 2, uncomplicated  . Aortic insufficiency    There are no active problems to display for this patient.   Past surgical history . Hysterectomy  . Egd 01/30/2014 [facility]  NON-BLEEDING ANGIODYSPLASTIC LESION IN THE STOMACH/TREATED/NO REPEAT/[place]  . Cholecystectomy  . Small bowel obstruction and lysis of adhesions    Current Outpatient Rx  Name  Route  Sig  Dispense  Refill  . acetaminophen (TYLENOL) 500 MG tablet   Oral   Take 500 mg by mouth 2 (two) times daily.           Allergies Review of patient's allergies indicates no known allergies.  History reviewed. No pertinent family history.  Social History History  Substance Use Topics  . Smoking status:  Never Smoker   . Smokeless tobacco: Never Used  . Alcohol Use: No    Review of Systems Constitutional: No fever/chills Eyes: No visual changes. ENT: No sore throat. Cardiovascular: Denies chest pain. Respiratory: Denies shortness of breath. Gastrointestinal: Positive for abdominal pain.  Positive for nausea, no vomiting.  No diarrhea.  Positive for constipation. Genitourinary: Negative for dysuria. Musculoskeletal: Negative for back pain. Skin: Negative for rash. Neurological: Negative for headaches, focal weakness or numbness.  10-point ROS otherwise negative.  ____________________________________________   PHYSICAL EXAM:  VITAL SIGNS: ED Triage Vitals  Enc Vitals Group     BP 01/25/15 2334 147/81 mmHg     Pulse Rate 01/25/15 2334 88     Resp 01/25/15 2334 20     Temp 01/25/15 2334 97.7 F (36.5 C)     Temp Source 01/25/15 2334 Oral     SpO2 01/25/15 2334 97 %     Weight 01/25/15 2334 182 lb 9.6 oz (82.827 kg)     Height 01/25/15 2334 5\' 4"  (1.626 m)     Head Cir --      Peak Flow --      Pain Score 01/25/15 2338 10     Pain Loc --      Pain Edu? --      Excl. in GC? --     Constitutional: Alert and oriented. Chronically ill-appearing and in no acute distress. Eyes: Conjunctivae are normal. PERRL. EOMI. Head: Atraumatic. Nose: No congestion/rhinnorhea. Mouth/Throat: Mucous membranes are mildly dry.  Oropharynx non-erythematous. Neck: No stridor.   Cardiovascular:  Normal rate, regular rhythm. Grossly normal heart sounds.  Good peripheral circulation. Respiratory: Normal respiratory effort.  No retractions. Lungs CTAB. Gastrointestinal: Soft, mildly tender to palpation lower abdomen. Decreased bowel sounds. No distention. No abdominal bruits. No CVA tenderness. Musculoskeletal: No tenderness to palpation bilateral lower extremities although patient states they "hurt all the time". Full range of motion. 2+ femoral and distal pulses; symmetrically warm limbs without  evidence of ischemia. Neurologic: Difficult to understand patient as she does not speak clearly. Son indicates this is baseline for patient. No gross focal neurologic deficits are appreciated. Speech is normal. Gait not tested. Skin:  Skin is warm, dry and intact. No rash noted. Psychiatric: Mood and affect are normal. Speech and behavior are normal.  ____________________________________________   LABS (all labs ordered are listed, but only abnormal results are displayed)  Labs Reviewed  CBC WITH DIFFERENTIAL/PLATELET - Abnormal; Notable for the following:    Neutro Abs 7.6 (*)    Lymphs Abs 0.9 (*)    Basophils Absolute 0.2 (*)    All other components within normal limits  URINALYSIS COMPLETEWITH MICROSCOPIC ([facility] ONLY) - Abnormal; Notable for the following:    Color, Urine YELLOW (*)    APPearance CLEAR (*)    Glucose, UA 50 (*)    Specific Gravity, Urine 1.031 (*)    Hgb urine dipstick 2+ (*)    All other components within normal limits  COMPREHENSIVE METABOLIC PANEL - Abnormal; Notable for the following:    Glucose, Bld 240 (*)    BUN 24 (*)    Creatinine, Ser 1.02 (*)    GFR calc non Af Amer 48 (*)    GFR calc Af Amer 55 (*)    All other components within normal limits  LIPASE, BLOOD  TROPONIN I   ____________________________________________  EKG  ED ECG REPORT I, SUNG,JADE J, the attending physician, personally viewed and interpreted this ECG.   Date: 01/26/2015  EKG Time: 2339  Rate: 88  Rhythm: normal EKG, normal sinus rhythm  Axis: Normal  Intervals:none  ST&T Change: Nonspecific  ____________________________________________  RADIOLOGY  CT abdomen and pelvis with contrast interpreted per Dr. Karie Kirks: Moderate amount of retained large bowel stool without bowel obstruction or acute intra-abdominal/ pelvic process.  Status post cholecystectomy with mild intrahepatic biliary dilatation. Minimal pancreatic duct dilatation without CT findings of  pancreatitis.  ____________________________________________   PROCEDURES  Procedure(s) performed: None  Critical Care performed: No  ____________________________________________   INITIAL IMPRESSION / ASSESSMENT AND PLAN / ED COURSE  Pertinent labs & imaging results that were available during my care of the patient were reviewed by me and considered in my medical decision making (see chart for details).  81 year old female who presents with lower abdominal pain, constipation and nausea. Patient is a vague historian. Given patient's self-reported remote history of bowel blockage, will proceed with CT abdomen/pelvis to evaluate etiology of patient's pain. Will reassess after IV analgesia and antiemetic.  ----------------------------------------- 3:46 AM on 01/26/2015 -----------------------------------------  Patient feels better. Updated patient and son of CT and urine results. Plan for lactulose, Zofran, Bentyl and follow-up PCP early next week. Strict return precautions given. Patient and son verbalized understanding and agree with plan of care. ____________________________________________   FINAL CLINICAL IMPRESSION(S) / ED DIAGNOSES  Final diagnoses:  Lower abdominal pain  Constipation, unspecified constipation type      Irean Hong, MD 01/26/15 [phone]

## 2015-01-26 NOTE — ED Notes (Signed)
Pt taken to CT.

## 2016-06-12 IMAGING — CT CT ABD-PELV W/ CM
2 of 5 series · 16 of 46 positions shown, 18 images · IV contrast (omnipaque)
Comparison: CT abdomen and pelvis report dated December 14, 2005
though images are not available for direct comparison.

CLINICAL DATA: Postprandial lower abdominal pain since 11 a.m..
Last bowel movement 2 weeks ago. History of hypertension.

EXAM:
CT ABDOMEN AND PELVIS WITH CONTRAST
TECHNIQUE: Multidetector CT imaging of the abdomen and pelvis was performed
using the standard protocol following bolus administration of
intravenous contrast.
CONTRAST:  100mL OMNIPAQUE IOHEXOL 300 MG/ML  SOLN

[Series 2: routine abd pel with · axial · 0.77mm/px · z∈[-430,-30]mm · 13 of 91 slices shown, 15 images]
[im 6/91  soft-tissue]
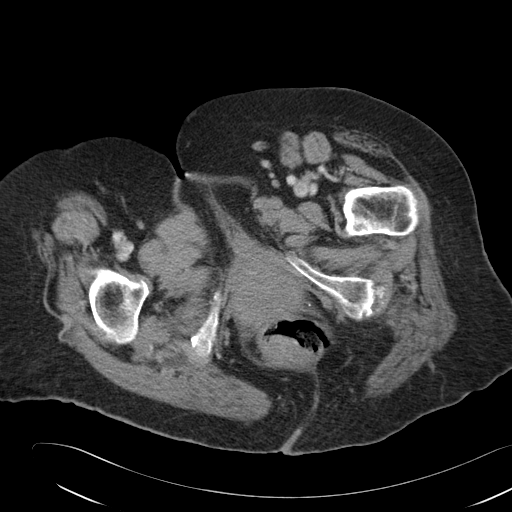
[im 6/91  bone]
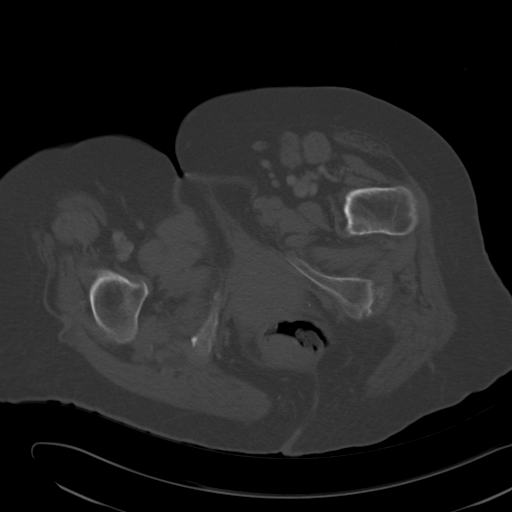
[im 11/91  soft-tissue]
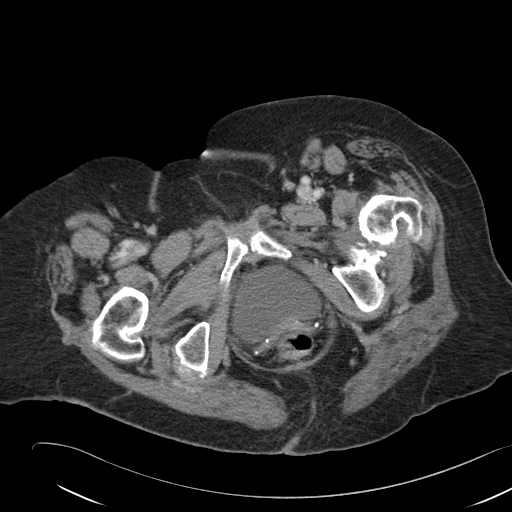
[im 21/91  soft-tissue]
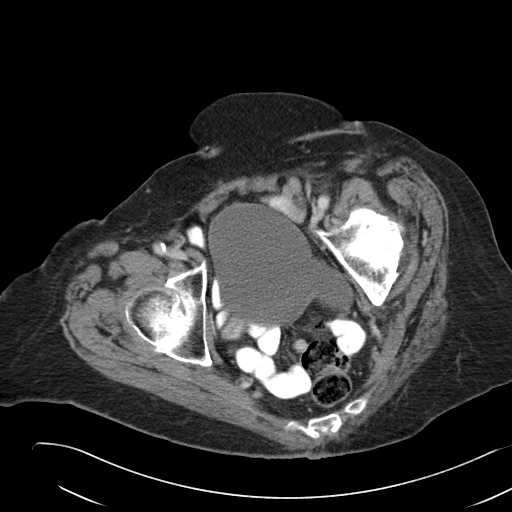
[im 26/91  soft-tissue]
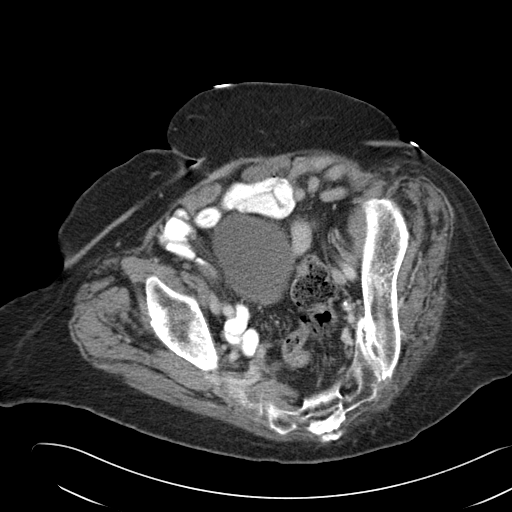
[im 31/91  soft-tissue]
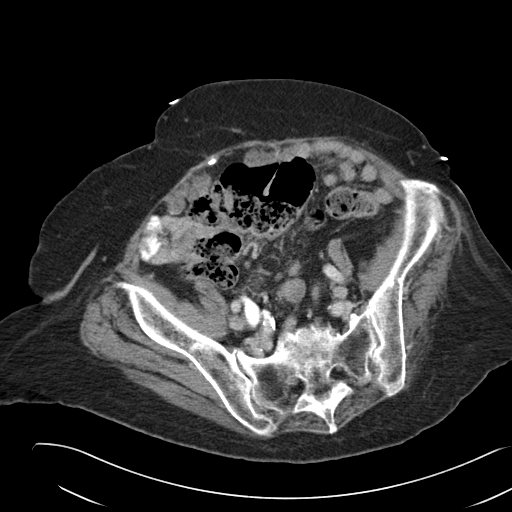
[im 41/91  soft-tissue]
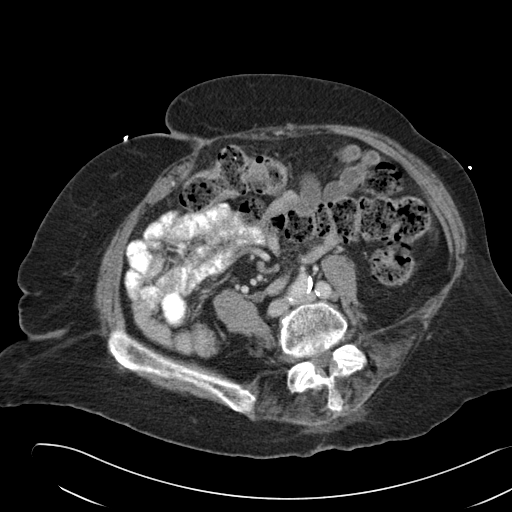
[im 46/91  soft-tissue]
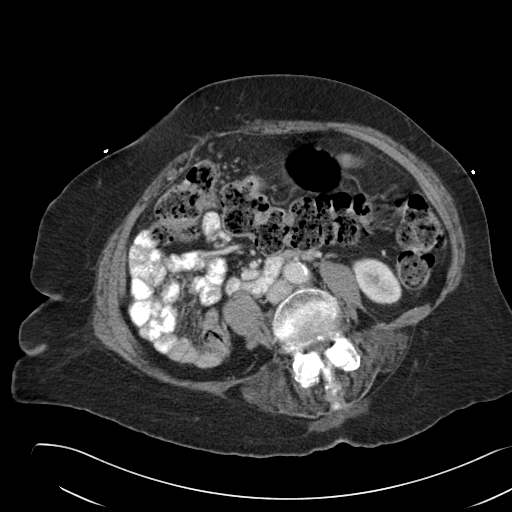
[im 51/91  soft-tissue]
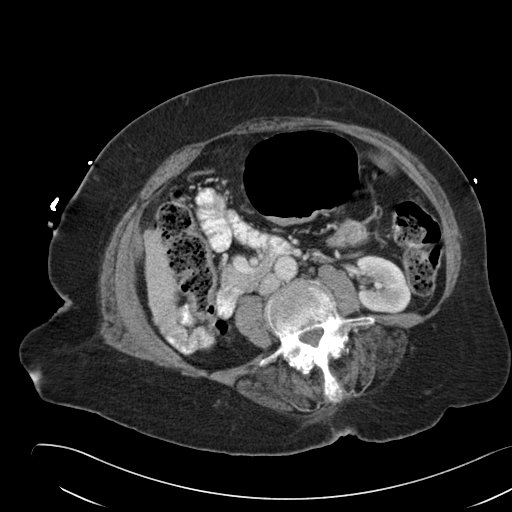
[im 61/91  soft-tissue]
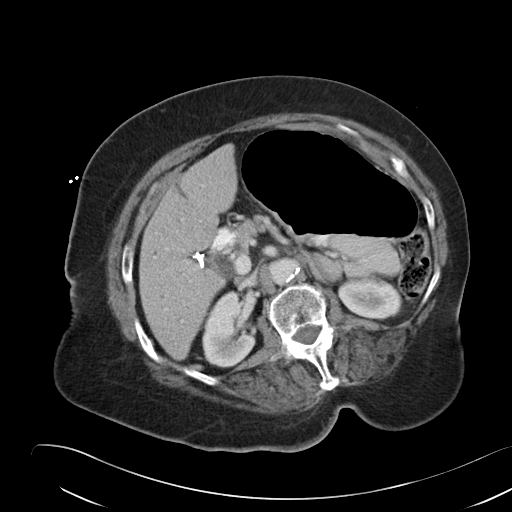
[im 61/91  bone]
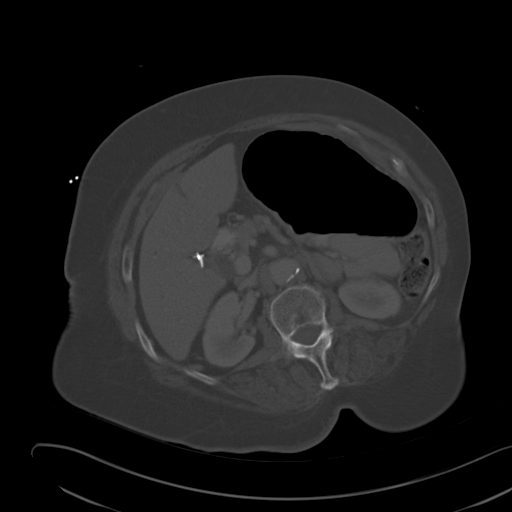
[im 66/91  soft-tissue]
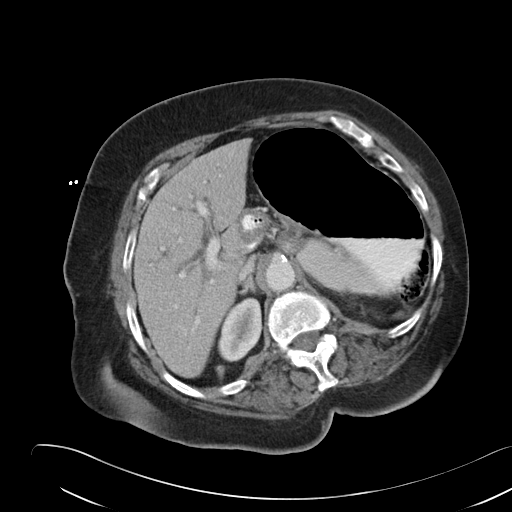
[im 71/91  soft-tissue]
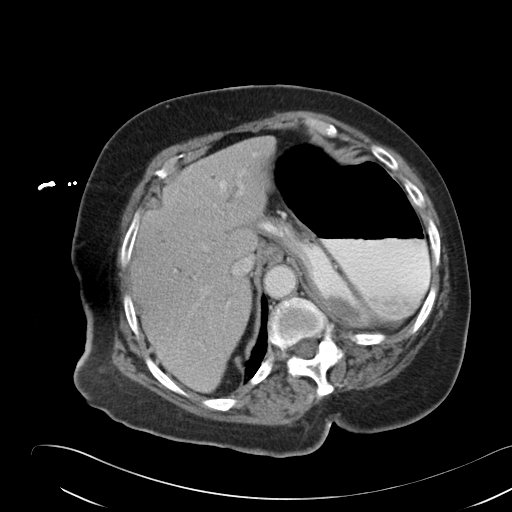
[im 81/91  soft-tissue]
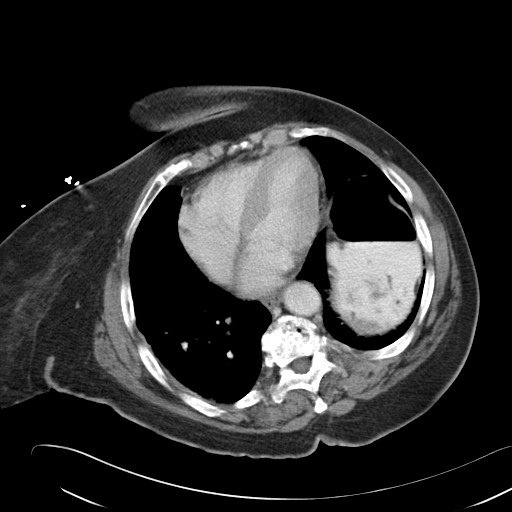
[im 86/91  soft-tissue]
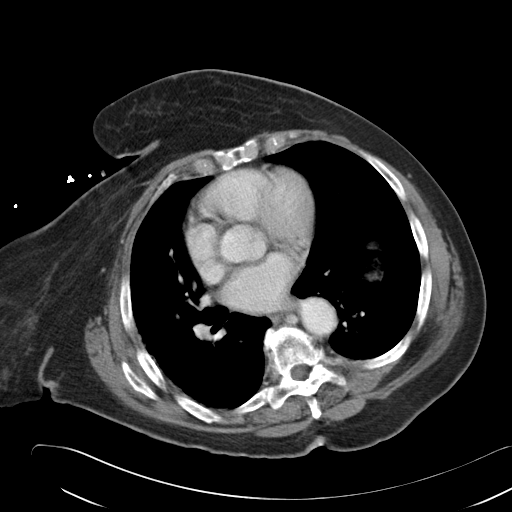

[Series 6: cor routine abd pel with · coronal · 0.99mm/px · 3 of 149 slices shown]
[im 50/149  soft-tissue]
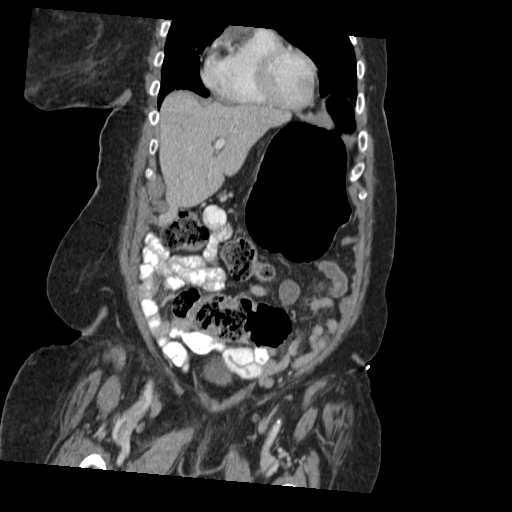
[im 66/149  soft-tissue]
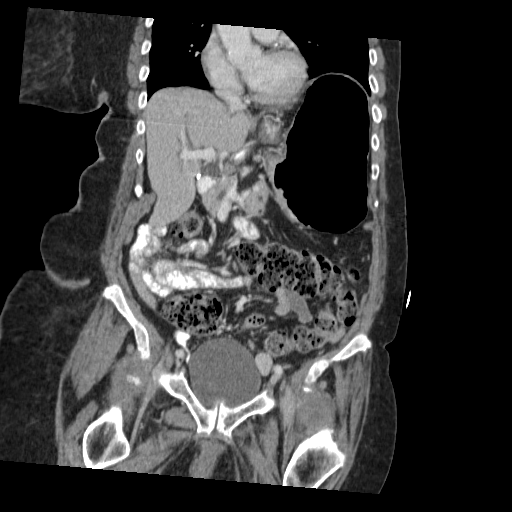
[im 83/149  soft-tissue]
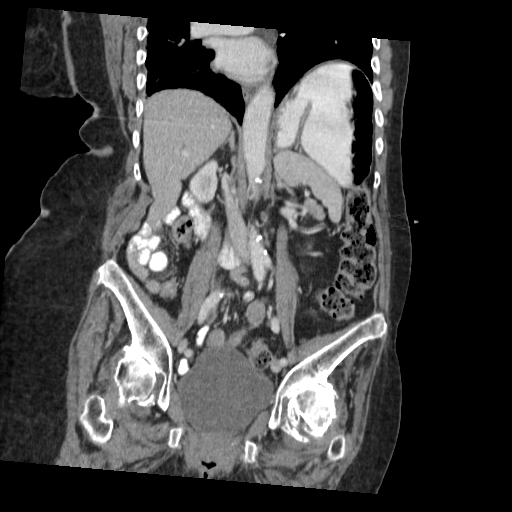

[16 of 46 positions shown; findings below may reference images not displayed]

FINDINGS: LUNG BASES: Included view of the lung bases demonstrate LEFT lower
lobe calcified granuloma. Visualized heart and pericardium are
unremarkable.

SOLID ORGANS: Mild intrahepatic biliary dilatation, status post
cholecystectomy. Liver is otherwise unremarkable. Mild pancreatic
duct dilatation, relatively atrophic pancreas without acute
findings. Spleen, adrenal glands are unremarkable.

GASTROINTESTINAL TRACT: Ptotic stomach, mildly distended with
contrast, otherwise unremarkable. Small and large bowel are normal
in course and caliber without inflammatory changes. Moderate amount
of retained large bowel stool. Enteric contrast has not yet reached
the distal small bowel. The appendix is not discretely identified,
however there are no inflammatory changes in the right lower
quadrant.

KIDNEYS/ URINARY TRACT: Kidneys are orthotopic, demonstrating
symmetric enhancement. No nephrolithiasis, hydronephrosis or solid
renal masses. The unopacified ureters are normal in course and
caliber. Delayed imaging through the kidneys demonstrates symmetric
prompt contrast excretion within the proximal urinary collecting
system. Urinary bladder is partially distended and unremarkable.

PERITONEUM/RETROPERITONEUM: Aortoiliac vessels are normal in course
and caliber, mild calcific atherosclerosis. No lymphadenopathy by CT
size criteria. Status post hysterectomy. No intraperitoneal free
fluid nor free air. Granuloma LEFT lower abdomen.

SOFT TISSUE/OSSEOUS STRUCTURES: Non-suspicious. Suture material
along anterior abdominal wall. Severe LEFT greater than RIGHT hip
osteoarthrosis. Grade 1 L3-4 anterolisthesis on degenerative basis.
IMPRESSION: Moderate amount of retained large bowel stool without bowel
obstruction or acute intra-abdominal/ pelvic process.

Status post cholecystectomy with mild intrahepatic biliary
dilatation. Minimal pancreatic duct dilatation without CT findings
of pancreatitis.

## 2016-08-26 DIAGNOSIS — Z515 Encounter for palliative care: Secondary | ICD-10-CM | POA: Diagnosis not present

## 2016-08-26 DIAGNOSIS — F419 Anxiety disorder, unspecified: Secondary | ICD-10-CM | POA: Diagnosis not present

## 2016-08-26 DIAGNOSIS — M1990 Unspecified osteoarthritis, unspecified site: Secondary | ICD-10-CM | POA: Diagnosis not present

## 2016-08-26 DIAGNOSIS — I10 Essential (primary) hypertension: Secondary | ICD-10-CM | POA: Diagnosis not present

## 2016-08-26 DIAGNOSIS — Z66 Do not resuscitate: Secondary | ICD-10-CM | POA: Diagnosis not present

## 2017-09-18 DEATH — deceased
